# Patient Record
Sex: Male | Born: 1985 | Race: Black or African American | Hispanic: No | Marital: Single | Smoking: Current every day smoker
Health system: Southern US, Community
[De-identification: ages and names within clinical notes are randomized; demographics above are authoritative.]

---

## 2006-02-04 ENCOUNTER — Emergency Department: Payer: Self-pay | Admitting: Emergency Medicine

## 2006-06-28 ENCOUNTER — Emergency Department: Payer: Self-pay | Admitting: Emergency Medicine

## 2006-12-03 ENCOUNTER — Emergency Department: Payer: Self-pay | Admitting: Emergency Medicine

## 2006-12-06 ENCOUNTER — Emergency Department: Payer: Self-pay | Admitting: Emergency Medicine

## 2008-02-02 ENCOUNTER — Emergency Department: Payer: Self-pay | Admitting: Emergency Medicine

## 2008-08-23 ENCOUNTER — Emergency Department: Payer: Self-pay | Admitting: Emergency Medicine

## 2008-09-15 ENCOUNTER — Emergency Department: Payer: Self-pay | Admitting: Emergency Medicine

## 2009-10-14 ENCOUNTER — Emergency Department: Payer: Self-pay | Admitting: Emergency Medicine

## 2016-12-29 ENCOUNTER — Encounter: Payer: Self-pay | Admitting: *Deleted

## 2016-12-29 ENCOUNTER — Emergency Department: Payer: Self-pay

## 2016-12-29 ENCOUNTER — Emergency Department
Admission: EM | Admit: 2016-12-29 | Discharge: 2016-12-29 | Disposition: A | Discharge status: Home / Self Care | Payer: Self-pay | Attending: Emergency Medicine | Admitting: Emergency Medicine

## 2016-12-29 DIAGNOSIS — W231XXA Caught, crushed, jammed, or pinched between stationary objects, initial encounter: Secondary | ICD-10-CM | POA: Insufficient documentation

## 2016-12-29 DIAGNOSIS — Y929 Unspecified place or not applicable: Secondary | ICD-10-CM | POA: Insufficient documentation

## 2016-12-29 DIAGNOSIS — Y939 Activity, unspecified: Secondary | ICD-10-CM | POA: Insufficient documentation

## 2016-12-29 DIAGNOSIS — S60222A Contusion of left hand, initial encounter: Secondary | ICD-10-CM | POA: Insufficient documentation

## 2016-12-29 DIAGNOSIS — Y990 Civilian activity done for income or pay: Secondary | ICD-10-CM | POA: Insufficient documentation

## 2016-12-29 MED ORDER — NAPROXEN 500 MG PO TABS: 500.0000 mg | ORAL_TABLET | Freq: Two times a day (BID) | ORAL | Status: DC

## 2016-12-29 MED ORDER — NAPROXEN 500 MG PO TABS
500.0000 mg | ORAL_TABLET | Freq: Once | ORAL | Status: AC
  Administered 2016-12-29: 500 mg via ORAL
  Filled 2016-12-29: qty 1

## 2016-12-29 MED ORDER — TRAMADOL HCL 50 MG PO TABS
50.0000 mg | ORAL_TABLET | Freq: Once | ORAL | Status: AC
  Administered 2016-12-29: 50 mg via ORAL
  Filled 2016-12-29: qty 1

## 2016-12-29 MED ORDER — TRAMADOL HCL 50 MG PO TABS: 50.0000 mg | ORAL_TABLET | Freq: Four times a day (QID) | ORAL | 0 refills | Status: DC | PRN

## 2016-12-29 NOTE — ED Provider Notes (Signed)
[facility] Emergency Department Provider Note   ____________________________________________   First MD Initiated Contact with Patient 12/29/16 1236     (approximate)  I have reviewed the triage vital signs and the nursing notes.   HISTORY  Chief Complaint Hand Pain    HPI James Melton is a 31 y.o. male patient complaining of left hand pain secondary to being smashed in a door at work 2 days ago. Patient is left-hand dominant.She rates pain as a 7/10. Patient described a pain as "achy". Patient denies loss sensation or loss of function of the left dominant hand. Patient  used over-the-counter anti-inflammatory medications with mild relief.   History reviewed. No pertinent past medical history.  There are no active problems to display for this patient.   History reviewed. No pertinent surgical history.  Prior to Admission medications   Medication Sig Start Date End Date Taking? Authorizing Provider  naproxen (NAPROSYN) 500 MG tablet Take 1 tablet (500 mg total) by mouth 2 (two) times daily with a meal. 12/29/16   Joni Reining, PA-C  traMADol (ULTRAM) 50 MG tablet Take 1 tablet (50 mg total) by mouth every 6 (six) hours as needed for moderate pain. 12/29/16   Joni Reining, PA-C    Allergies Patient has no known allergies.  No family history on file.  Social History Social History  Substance Use Topics  . Smoking status: Not on file  . Smokeless tobacco: Not on file  . Alcohol use Not on file    Review of Systems  Constitutional: No fever/chills Eyes: No visual changes. ENT: No sore throat. Cardiovascular: Denies chest pain. Respiratory: Denies shortness of breath. Gastrointestinal: No abdominal pain.  No nausea, no vomiting.  No diarrhea.  No constipation. Genitourinary: Negative for dysuria. Musculoskeletal: Left hand pain Skin: Negative for rash. Neurological: Negative for headaches, focal weakness or  numbness.   ____________________________________________   PHYSICAL EXAM:  VITAL SIGNS: ED Triage Vitals  Enc Vitals Group     BP 12/29/16 1224 124/82     Pulse Rate 12/29/16 1224 76     Resp 12/29/16 1224 18     Temp 12/29/16 1224 98.5 F (36.9 C)     Temp Source 12/29/16 1224 Oral     SpO2 12/29/16 1224 98 %     Weight 12/29/16 1221 160 lb (72.6 kg)     Height 12/29/16 1221 6' (1.829 m)     Head Circumference --      Peak Flow --      Pain Score 12/29/16 1221 7     Pain Loc --      Pain Edu? --      Excl. in GC? --     Constitutional: Alert and oriented. Well appearing and in no acute distress. Cardiovascular: Normal rate, regular rhythm. Grossly normal heart sounds.  Good peripheral circulation. Respiratory: Normal respiratory effort.  No retractions. Lungs CTAB. Gastrointestinal: Soft and nontender. No distention. No abdominal bruits. No CVA tenderness. Musculoskeletal: No obvious deformity to the left hand. No abrasion or ecchymosis. Patient has moderate guarding palpation third and fourth digit left hand.  Neurologic:  Normal speech and language. No gross focal neurologic deficits are appreciated. No gait instability. Skin:  Skin is warm, dry and intact. No rash noted. Psychiatric: Mood and affect are normal. Speech and behavior are normal.  ____________________________________________   LABS (all labs ordered are listed, but only abnormal results are displayed)  Labs Reviewed - No data to display  ____________________________________________  EKG   ____________________________________________  RADIOLOGY  Dg Hand Complete Left  Result Date: 12/29/2016 CLINICAL DATA:  Patient reports he was reaching for a door as it was opening with his left hand and struck the posterior surface at the 2nd and 3rd MCP joints. Reports pain and swelling at 2nd and 3rd MCP joints. No previous injury or surgery to left hand. EXAM: LEFT HAND - COMPLETE 3+ VIEW COMPARISON:   06/28/2006 FINDINGS: There is no evidence of fracture or dislocation. There is a periarticular erosion involving the lateral third metacarpal head with a small bony fragment at the erosions site which may be secondary to an inflammatory or crystalline arthropathy. There is a possible stable marginal erosion involving the base of the fifth proximal phalanx along the lateral aspect. Soft tissues are unremarkable. IMPRESSION: 1.  No acute osseous injury of the left hand. 2. Periarticular erosion involving the lateral third metacarpal head with a small bony fragment at the erosions site which may be secondary to an inflammatory or crystalline arthropathy. Electronically Signed   By: Elige Ko   On: 12/29/2016 13:30   No acute findings x-ray of left hand. ____________________________________________   PROCEDURES  Procedure(s) performed: None  Procedures  Critical Care performed: No  ____________________________________________   INITIAL IMPRESSION / ASSESSMENT AND PLAN / ED COURSE  Pertinent labs & imaging results that were available during my care of the patient were reviewed by me and considered in my medical decision making (see chart for details).  Pain secondary to contusion left hand. Discussed negative x-ray finding with patient. Patient given discharge care instructions. Patient advised [facility].      ____________________________________________   FINAL CLINICAL IMPRESSION(S) / ED DIAGNOSES  Final diagnoses:  Contusion of left hand, initial encounter      NEW MEDICATIONS STARTED DURING THIS VISIT:  New Prescriptions   NAPROXEN (NAPROSYN) 500 MG TABLET    Take 1 tablet (500 mg total) by mouth 2 (two) times daily with a meal.   TRAMADOL (ULTRAM) 50 MG TABLET    Take 1 tablet (50 mg total) by mouth every 6 (six) hours as needed for moderate pain.     Note:  This document was prepared using Dragon voice recognition  software and may include unintentional dictation errors.    Joni Reining, PA-C 12/29/16 1350    Jene Every, MD 12/29/16 [phone]
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
## 2016-12-29 NOTE — ED Triage Notes (Signed)
Pt states 2 days ago he smashed his left hand with a door at work, pt states he does not want to file workers comp

## 2016-12-29 NOTE — ED Notes (Signed)
Pt discharged home after verbalizing understanding of discharge instructions; nad noted. 

## 2019-09-29 ENCOUNTER — Emergency Department: Payer: No Typology Code available for payment source

## 2019-09-29 ENCOUNTER — Encounter: Payer: Self-pay | Admitting: Emergency Medicine

## 2019-09-29 ENCOUNTER — Emergency Department
Admission: EM | Admit: 2019-09-29 | Discharge: 2019-09-29 | Disposition: A | Discharge status: Home / Self Care | Payer: No Typology Code available for payment source | Attending: Emergency Medicine | Admitting: Emergency Medicine

## 2019-09-29 ENCOUNTER — Other Ambulatory Visit: Payer: Self-pay

## 2019-09-29 DIAGNOSIS — Y93I9 Activity, other involving external motion: Secondary | ICD-10-CM | POA: Insufficient documentation

## 2019-09-29 DIAGNOSIS — F17210 Nicotine dependence, cigarettes, uncomplicated: Secondary | ICD-10-CM | POA: Insufficient documentation

## 2019-09-29 DIAGNOSIS — M79671 Pain in right foot: Secondary | ICD-10-CM | POA: Diagnosis not present

## 2019-09-29 DIAGNOSIS — Y999 Unspecified external cause status: Secondary | ICD-10-CM | POA: Insufficient documentation

## 2019-09-29 DIAGNOSIS — Y92410 Unspecified street and highway as the place of occurrence of the external cause: Secondary | ICD-10-CM | POA: Insufficient documentation

## 2019-09-29 DIAGNOSIS — M545 Low back pain: Secondary | ICD-10-CM | POA: Diagnosis not present

## 2019-09-29 MED ORDER — IBUPROFEN 600 MG PO TABS: 600.0000 mg | ORAL_TABLET | Freq: Four times a day (QID) | ORAL | 0 refills | Status: AC | PRN

## 2019-09-29 MED ORDER — CYCLOBENZAPRINE HCL 5 MG PO TABS: ORAL_TABLET | ORAL | 0 refills | Status: AC

## 2019-09-29 NOTE — ED Notes (Addendum)
Post op shoe placed by er tech. Patient refused crutches.
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
## 2019-09-29 NOTE — Discharge Instructions (Signed)
There are no fractures on your x-rays.  Please ice and elevate foot tonight.  Please call podiatry and primary care for a follow-up appointment.
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
